# Patient Record
Sex: Male | Born: 1980 | Race: White | Hispanic: No | Marital: Single | Smoking: Current every day smoker
Health system: Southern US, Community
[De-identification: ages and names within clinical notes are randomized; demographics above are authoritative.]

## PROBLEM LIST (undated history)

## (undated) DIAGNOSIS — N200 Calculus of kidney: Secondary | ICD-10-CM

---

## 2012-11-23 ENCOUNTER — Emergency Department (HOSPITAL_COMMUNITY): Payer: Self-pay

## 2012-11-23 ENCOUNTER — Emergency Department (HOSPITAL_COMMUNITY)
Admission: EM | Admit: 2012-11-23 | Discharge: 2012-11-23 | Disposition: A | Discharge status: Home / Self Care | Payer: Self-pay | Attending: Emergency Medicine | Admitting: Emergency Medicine

## 2012-11-23 ENCOUNTER — Encounter (HOSPITAL_COMMUNITY): Payer: Self-pay | Admitting: Emergency Medicine

## 2012-11-23 DIAGNOSIS — R079 Chest pain, unspecified: Secondary | ICD-10-CM

## 2012-11-23 DIAGNOSIS — Y9355 Activity, bike riding: Secondary | ICD-10-CM | POA: Insufficient documentation

## 2012-11-23 DIAGNOSIS — S8002XA Contusion of left knee, initial encounter: Secondary | ICD-10-CM

## 2012-11-23 DIAGNOSIS — F17200 Nicotine dependence, unspecified, uncomplicated: Secondary | ICD-10-CM | POA: Insufficient documentation

## 2012-11-23 DIAGNOSIS — Z23 Encounter for immunization: Secondary | ICD-10-CM | POA: Insufficient documentation

## 2012-11-23 DIAGNOSIS — S8000XA Contusion of unspecified knee, initial encounter: Secondary | ICD-10-CM | POA: Insufficient documentation

## 2012-11-23 DIAGNOSIS — S20212A Contusion of left front wall of thorax, initial encounter: Secondary | ICD-10-CM

## 2012-11-23 DIAGNOSIS — Y92410 Unspecified street and highway as the place of occurrence of the external cause: Secondary | ICD-10-CM | POA: Insufficient documentation

## 2012-11-23 DIAGNOSIS — S20219A Contusion of unspecified front wall of thorax, initial encounter: Secondary | ICD-10-CM | POA: Insufficient documentation

## 2012-11-23 MED ORDER — HYDROCODONE-ACETAMINOPHEN 5-325 MG PO TABS: 1.0000 | ORAL_TABLET | ORAL | Status: AC | PRN

## 2012-11-23 MED ORDER — TETANUS-DIPHTH-ACELL PERTUSSIS 5-2.5-18.5 LF-MCG/0.5 IM SUSP
0.5000 mL | Freq: Once | INTRAMUSCULAR | Status: AC
  Administered 2012-11-23: 0.5 mL via INTRAMUSCULAR
  Filled 2012-11-23: qty 0.5

## 2012-11-23 MED ORDER — MORPHINE SULFATE 4 MG/ML IJ SOLN
6.0000 mg | Freq: Once | INTRAMUSCULAR | Status: DC
  Filled 2012-11-23: qty 2

## 2012-11-23 MED ORDER — KETOROLAC TROMETHAMINE 30 MG/ML IJ SOLN
30.0000 mg | Freq: Once | INTRAMUSCULAR | Status: AC
  Administered 2012-11-23: 30 mg via INTRAVENOUS
  Filled 2012-11-23: qty 1

## 2012-11-23 MED ORDER — IBUPROFEN 600 MG PO TABS: 600.0000 mg | ORAL_TABLET | Freq: Three times a day (TID) | ORAL | Status: AC | PRN

## 2012-11-23 MED ORDER — HYDROMORPHONE HCL PF 1 MG/ML IJ SOLN
1.0000 mg | Freq: Once | INTRAMUSCULAR | Status: AC
  Administered 2012-11-23: 1 mg via INTRAVENOUS
  Filled 2012-11-23: qty 1

## 2012-11-23 MED ORDER — OXYCODONE-ACETAMINOPHEN 5-325 MG PO TABS
2.0000 | ORAL_TABLET | Freq: Once | ORAL | Status: AC
  Administered 2012-11-23: 2 via ORAL
  Filled 2012-11-23: qty 2
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
## 2016-07-17 ENCOUNTER — Ambulatory Visit (INDEPENDENT_AMBULATORY_CARE_PROVIDER_SITE_OTHER): Payer: Worker's Compensation

## 2016-07-17 ENCOUNTER — Encounter: Payer: Self-pay | Admitting: Family Medicine

## 2016-07-17 ENCOUNTER — Ambulatory Visit (INDEPENDENT_AMBULATORY_CARE_PROVIDER_SITE_OTHER): Payer: Worker's Compensation | Admitting: Family Medicine

## 2016-07-17 VITALS — BP 122/75 | HR 61 | Temp 98.3°F | Resp 18 | Ht 70.0 in | Wt 178.8 lb

## 2016-07-17 DIAGNOSIS — S6010XA Contusion of unspecified finger with damage to nail, initial encounter: Secondary | ICD-10-CM | POA: Diagnosis not present

## 2016-07-17 DIAGNOSIS — M79645 Pain in left finger(s): Secondary | ICD-10-CM

## 2016-07-17 NOTE — Progress Notes (Signed)
     Corey Duncan [birth year] 36 y.o.   Chief Complaint  Patient presents with  . Hand Pain    smashed left hand ring finger x2days     Presents for evaluation of work-related complaint.  Date of Injury: 07/15/16  History of Present Illness:  Patient was at work on Tuesday.  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  He was taking materials off a roller and thinks his glove got caught in a press.  It pulled his Left Ring finger under the press.  Had immediate pain.  Continued working but difficult due to pain and needing to use his hands.    Pain has continued.  Bruising and swelling distal finger including hematoma formation under nail.  No further injury to finger since then.  Has had difficulty sleeping at night due to pain.  Not relieved with OTC analgesics.    Has broken multiple fingers in past.  Is worried this has happened again.    No deformity to finger.  No redness or drainage.     ROS The patient denies fever, unusual weight change, decreased hearing, chest pain, palpitations, pre-syncopal or syncopal episodes, dyspnea on exertion, prolonged cough, hemoptysis, change in bowel habits, melena, hematochezia, severe indigestion/heartburn, nausea/vomiting/abdominal pain,abornmal bleeding.  Current medications and allergies reviewed and updated. Past medical history, family history, social history have been reviewed and updated.   Physical Exam  Gen:  Alert, cooperative patient who appears stated age in no acute distress.  Vital signs reviewed. Ext:  Right hand WNL - Left hand:  4th digit with subungual hematoma.  Some bruising surrounding nail.  TTP directly here and distal to DIP joint.  No crepitus noted.  No bone deformity.  Some mild tenderness middle phalanges.  No tenderness PIP joint.  No tenderness proximal to PIP joint.  Can bend finger about halfway before being limited by pain.  No rotation of finger.    Assessment: 1.  Subungual Hematoma: -  evacuated in clinic.  Verbal consent obtained.  Nail puncture using 18 gauge needle. Immediate relief.  Evacuation of dark red venous blood.  Hematoma evacuated and bruising below nail improved.  No complications.  Bleeding stopped easily with direct pressure.  Patient tolerated procedure well.  Bandaged here in clinic.  To keep clean.  No need prophylactic abx currently.    2.  Bruised finger - No evidence of fracture on radiographs - OTC analgesics should be sufficient for pain relief s/p evacuation, which relieved a great deal of his pain.   - FU if no improvement in pain next 3-4 days.

## 2016-07-17 NOTE — Patient Instructions (Signed)
     IF you received an x-ray today, you will receive an invoice from [facility]. Please contact [facility] at [phone] with questions or concerns regarding your invoice.   IF you received labwork today, you will receive an invoice from [facility]. Please contact [facility] at [phone] with questions or concerns regarding your invoice.   Our billing staff will not be able to assist you with questions regarding bills from these companies.  You will be contacted with the lab results as soon as they are available. The fastest way to get your results is to activate your My Chart account. Instructions are located on the last page of this paperwork. If you have not heard from us regarding the results in 2 weeks, please contact this office.     

## 2018-10-05 ENCOUNTER — Other Ambulatory Visit: Payer: Self-pay | Admitting: Obstetrics and Gynecology

## 2020-10-04 ENCOUNTER — Encounter (HOSPITAL_COMMUNITY): Payer: Self-pay | Admitting: Emergency Medicine

## 2020-10-04 ENCOUNTER — Emergency Department (HOSPITAL_COMMUNITY): Payer: PRIVATE HEALTH INSURANCE

## 2020-10-04 ENCOUNTER — Emergency Department (HOSPITAL_COMMUNITY)
Admission: EM | Admit: 2020-10-04 | Discharge: 2020-10-05 | Disposition: A | Discharge status: Home / Self Care | Payer: PRIVATE HEALTH INSURANCE | Attending: Emergency Medicine | Admitting: Emergency Medicine

## 2020-10-04 ENCOUNTER — Ambulatory Visit (HOSPITAL_COMMUNITY)
Admission: EM | Admit: 2020-10-04 | Discharge: 2020-10-04 | Disposition: A | Discharge status: Home / Self Care | Payer: PRIVATE HEALTH INSURANCE

## 2020-10-04 ENCOUNTER — Other Ambulatory Visit: Payer: Self-pay

## 2020-10-04 DIAGNOSIS — R109 Unspecified abdominal pain: Secondary | ICD-10-CM

## 2020-10-04 DIAGNOSIS — F17210 Nicotine dependence, cigarettes, uncomplicated: Secondary | ICD-10-CM | POA: Diagnosis not present

## 2020-10-04 DIAGNOSIS — R339 Retention of urine, unspecified: Secondary | ICD-10-CM | POA: Diagnosis not present

## 2020-10-04 DIAGNOSIS — R110 Nausea: Secondary | ICD-10-CM | POA: Diagnosis not present

## 2020-10-04 DIAGNOSIS — R1031 Right lower quadrant pain: Secondary | ICD-10-CM | POA: Diagnosis not present

## 2020-10-04 DIAGNOSIS — R195 Other fecal abnormalities: Secondary | ICD-10-CM | POA: Diagnosis not present

## 2020-10-04 DIAGNOSIS — K921 Melena: Secondary | ICD-10-CM

## 2020-10-04 DIAGNOSIS — Z91040 Latex allergy status: Secondary | ICD-10-CM | POA: Diagnosis not present

## 2020-10-04 HISTORY — DX: Calculus of kidney: N20.0

## 2020-10-04 LAB — URINALYSIS, ROUTINE W REFLEX MICROSCOPIC
Bilirubin Urine: NEGATIVE
Glucose, UA: NEGATIVE mg/dL
Hgb urine dipstick: NEGATIVE
Ketones, ur: NEGATIVE mg/dL
Leukocytes,Ua: NEGATIVE
Nitrite: NEGATIVE
Protein, ur: NEGATIVE mg/dL
Specific Gravity, Urine: >1.03 — ABNORMAL HIGH (ref 1.005–1.030)
pH: 6 (ref 5.0–8.0)

## 2020-10-04 MED ORDER — OXYCODONE-ACETAMINOPHEN 5-325 MG PO TABS
1.0000 | ORAL_TABLET | Freq: Once | ORAL | Status: AC
  Administered 2020-10-04: 1 via ORAL
  Filled 2020-10-04: qty 1

## 2020-10-04 NOTE — ED Triage Notes (Signed)
Pt repots passed 2 kidney stones yesterday. Reports still having right flank pains and when voids only little amounts will come out at at a time. Pt reports having rectal bleeding when has a BM that started yesterday. Blood is bright red. Denies having to strain or hard or loose stools. Hx hemorrhoids 15 years ago. Deniestaking blood thinners.

## 2020-10-04 NOTE — ED Provider Notes (Signed)
Emergency Medicine Provider Triage Evaluation Note  Corey Duncan , a 40 y.o. male  was evaluated in triage.  Pt complains of kidney stones. Symptoms started Monday and passed 2 stones on Wednesday. Sharp/stabbing pain in R flank. Difficulty urinating.  Review of Systems  Positive: Flank pain, abdominal pain, decreased urination Negative: Fever, chills, nausea, vomiting  Physical Exam  BP 125/87 (BP Location: Left Arm)   Pulse (!) 102   Temp 99.8 F (37.7 C)   Resp 16   SpO2 96%  Gen:   Awake, no distress   Resp:  Normal effort  MSK:   Moves extremities without difficulty Other:    Medical Decision Making  Medically screening exam initiated at 8:01 PM.  Appropriate orders placed.  Corey Duncan was informed that the remainder of the evaluation will be completed by another provider, this initial triage assessment does not replace that evaluation, and the importance of remaining in the ED until their evaluation is complete.     Jeanella Flattery 10/04/20 2004    Arby Barrette, MD 10/04/20 [phone]

## 2020-10-04 NOTE — ED Notes (Signed)
Patient called for vital x3.

## 2020-10-04 NOTE — ED Triage Notes (Signed)
Pt states he's got kidney stones, passed 2 yesterday, symptom onset Monday. States he is "barely" able to pass urine. Hx of same, states symptoms are the same. Pain worse in R back 7/10 dull ache to sharp/stabby.  Last dose of ibuprofen approx 1400

## 2020-10-04 NOTE — ED Provider Notes (Signed)
Patient reports only urinating about an ounce at a time due to difficulty urinating.  Continues to have significant flank pain.  Recommended further evaluation in the ED for CT to rule out stone obstruction.   Tomi Bamberger, PA-C 10/04/20 1931

## 2020-10-05 LAB — CBC WITH DIFFERENTIAL/PLATELET
Abs Immature Granulocytes: 0.01 10*3/uL (ref 0.00–0.07)
Basophils Absolute: 0 10*3/uL (ref 0.0–0.1)
Basophils Relative: 0 %
Eosinophils Absolute: 0 10*3/uL (ref 0.0–0.5)
Eosinophils Relative: 0 %
HCT: 46.2 % (ref 39.0–52.0)
Hemoglobin: 15.2 g/dL (ref 13.0–17.0)
Immature Granulocytes: 0 %
Lymphocytes Relative: 32 %
Lymphs Abs: 1.3 10*3/uL (ref 0.7–4.0)
MCH: 29.7 pg (ref 26.0–34.0)
MCHC: 32.9 g/dL (ref 30.0–36.0)
MCV: 90.4 fL (ref 80.0–100.0)
Monocytes Absolute: 0.3 10*3/uL (ref 0.1–1.0)
Monocytes Relative: 6 %
Neutro Abs: 2.4 10*3/uL (ref 1.7–7.7)
Neutrophils Relative %: 62 %
Platelets: 79 10*3/uL — ABNORMAL LOW (ref 150–400)
RBC: 5.11 MIL/uL (ref 4.22–5.81)
RDW: 13.9 % (ref 11.5–15.5)
WBC: 3.9 10*3/uL — ABNORMAL LOW (ref 4.0–10.5)
nRBC: 0 % (ref 0.0–0.2)

## 2020-10-05 LAB — BASIC METABOLIC PANEL
Anion gap: 8 (ref 5–15)
BUN: 14 mg/dL (ref 6–20)
CO2: 24 mmol/L (ref 22–32)
Calcium: 7.8 mg/dL — ABNORMAL LOW (ref 8.9–10.3)
Chloride: 102 mmol/L (ref 98–111)
Creatinine, Ser: 0.92 mg/dL (ref 0.61–1.24)
GFR, Estimated: >60 mL/min (ref >60)
Glucose, Bld: 96 mg/dL (ref 70–99)
Potassium: 3.6 mmol/L (ref 3.5–5.1)
Sodium: 134 mmol/L — ABNORMAL LOW (ref 135–145)

## 2020-10-05 LAB — POC OCCULT BLOOD, ED: Fecal Occult Bld: NEGATIVE

## 2020-10-05 MED ORDER — KETOROLAC TROMETHAMINE 15 MG/ML IJ SOLN
15.0000 mg | Freq: Once | INTRAMUSCULAR | Status: AC
  Administered 2020-10-05: 15 mg via INTRAVENOUS
  Filled 2020-10-05: qty 1

## 2020-10-05 MED ORDER — METHOCARBAMOL 500 MG PO TABS
1000.0000 mg | ORAL_TABLET | Freq: Once | ORAL | Status: AC
  Administered 2020-10-05: 1000 mg via ORAL
  Filled 2020-10-05: qty 2

## 2020-10-05 MED ORDER — DICYCLOMINE HCL 20 MG PO TABS: 20.0000 mg | ORAL_TABLET | Freq: Two times a day (BID) | ORAL | 0 refills | Status: AC

## 2020-10-05 MED ORDER — ONDANSETRON 4 MG PO TBDP: 4.0000 mg | ORAL_TABLET | Freq: Three times a day (TID) | ORAL | 0 refills | Status: AC | PRN

## 2020-10-05 MED ORDER — SODIUM CHLORIDE 0.9 % IV BOLUS
1000.0000 mL | Freq: Once | INTRAVENOUS | Status: AC
  Administered 2020-10-05: 1000 mL via INTRAVENOUS

## 2020-10-05 MED ORDER — ONDANSETRON HCL 4 MG/2ML IJ SOLN
4.0000 mg | Freq: Once | INTRAMUSCULAR | Status: AC
Start: 2020-10-05 — End: 2020-10-05
  Administered 2020-10-05: 4 mg via INTRAVENOUS
  Filled 2020-10-05: qty 2

## 2020-10-05 NOTE — ED Notes (Signed)
Patient given discharge instructions. Questions were answered. Patient verbalized understanding of discharge instructions and care at home.  

## 2020-10-05 NOTE — ED Provider Notes (Signed)
[facility] EMERGENCY DEPARTMENT Provider Note   CSN: [id number] Arrival date & time: 10/04/20  1944     History Chief Complaint  Patient presents with   Abdominal Pain   Flank Pain    Corey Duncan is a 40 y.o. male presenting for evaluation of right-sided flank pain and decreased urination.  Patient states on Monday he started to have pain in the right flank and lower abdomen.  Consistent with previous kidney stones.  He saw 2 kidney stones passed yesterday.  Initially his pain was improved, however over the past day, pain has worsened again.  He also states he feels he needs to urinate, but not much is coming out.  He reports associated nausea, no vomiting.  No fevers or chills.  Patient states also in the same amount of time he has been having bright red blood in his stool.  No blood in his underwear between bowel movements.  He is not on blood thinners.  No history of similar.  He has not have any straining.  He has no other medical problems, takes no medications daily.  HPI     Past Medical History:  Diagnosis Date   Kidney stone     There are no problems to display for this patient.   No past surgical history on file.     No family history on file.  Social History   Tobacco Use   Smoking status: Every Day    Packs/day: 1.50    Types: Cigarettes   Smokeless tobacco: Current  Substance Use Topics   Alcohol use: Yes    Alcohol/week: 12.0 standard drinks    Types: 12 Cans of beer per week   Drug use: No    Home Medications Prior to Admission medications   Medication Sig Start Date End Date Taking? Authorizing Provider  dicyclomine (BENTYL) 20 MG tablet Take 1 tablet (20 mg total) by mouth 2 (two) times daily. 10/05/20  Yes Babyboy Loya, PA-C  ondansetron (ZOFRAN ODT) 4 MG disintegrating tablet Take 1 tablet (4 mg total) by mouth every 8 (eight) hours as needed for nausea or vomiting. 10/05/20  Yes Ilissa Rosner, PA-C   HYDROcodone-acetaminophen (NORCO/VICODIN) 5-325 MG per tablet Take 1 tablet by mouth every 4 (four) hours as needed for pain. Patient not taking: No sig reported 11/23/12   Azalia Bilis, MD  ibuprofen (ADVIL,MOTRIN) 200 MG tablet Take 800 mg by mouth every 6 (six) hours as needed for pain (back pain   takes everyday).    [provider]  ibuprofen (ADVIL,MOTRIN) 600 MG tablet Take 1 tablet (600 mg total) by mouth every 8 (eight) hours as needed for pain. Patient not taking: No sig reported 11/23/12   Azalia Bilis, MD    Allergies    Latex  Review of Systems   Review of Systems  Gastrointestinal:  Positive for abdominal pain and nausea.  Genitourinary:  Positive for decreased urine volume and flank pain.  All other systems reviewed and are negative.  Physical Exam Updated Vital Signs BP 107/71 (BP Location: Right Arm)   Pulse 62   Temp 98.9 F (37.2 C) (Oral)   Resp 16   SpO2 97%   Physical Exam Vitals and nursing note reviewed. Exam conducted with a chaperone present.  Constitutional:      General: He is not in acute distress.    Appearance: Normal appearance.     Comments: nontoxic  HENT:     Head: Normocephalic and atraumatic.  Eyes:  Conjunctiva/sclera: Conjunctivae normal.     Pupils: Pupils are equal, round, and reactive to light.  Cardiovascular:     Rate and Rhythm: Normal rate and regular rhythm.     Pulses: Normal pulses.  Pulmonary:     Effort: Pulmonary effort is normal. No respiratory distress.     Breath sounds: Normal breath sounds. No wheezing.     Comments: Speaking in full sentences.  Clear lung sounds in all fields. Abdominal:     General: There is no distension.     Palpations: Abdomen is soft.     Tenderness: There is no abdominal tenderness. There is right CVA tenderness.  Genitourinary:    Comments: No gross bleeding. No hemorrhoids. Hemoccult negative.  Musculoskeletal:        General: Normal range of motion.     Cervical back:  Normal range of motion and neck supple.  Skin:    General: Skin is warm and dry.     Capillary Refill: Capillary refill takes less than 2 seconds.  Neurological:     Mental Status: He is alert and oriented to person, place, and time.  Psychiatric:        Mood and Affect: Mood and affect normal.        Speech: Speech normal.        Behavior: Behavior normal.    ED Results / Procedures / Treatments   Labs (all labs ordered are listed, but only abnormal results are displayed) Labs Reviewed  URINALYSIS, ROUTINE W REFLEX MICROSCOPIC - Abnormal; Notable for the following components:      Result Value   Specific Gravity, Urine >1.030 (*)    All other components within normal limits  CBC WITH DIFFERENTIAL/PLATELET - Abnormal; Notable for the following components:   WBC 3.9 (*)    Platelets 79 (*)    All other components within normal limits  BASIC METABOLIC PANEL - Abnormal; Notable for the following components:   Sodium 134 (*)    Calcium 7.8 (*)    All other components within normal limits  POC OCCULT BLOOD, ED    EKG None  Radiology CT Renal Stone Study  Result Date: 10/04/2020 CLINICAL DATA:  Right flank pain, rectal bleeding. EXAM: CT ABDOMEN AND PELVIS WITHOUT CONTRAST TECHNIQUE: Multidetector CT imaging of the abdomen and pelvis was performed following the standard protocol without IV contrast. COMPARISON:  July 14, 2012. FINDINGS: Lower chest: No acute abnormality. Hepatobiliary: No focal liver abnormality is seen. No gallstones, gallbladder wall thickening, or biliary dilatation. Pancreas: Unremarkable. No pancreatic ductal dilatation or surrounding inflammatory changes. Spleen: Normal in size without focal abnormality. Adrenals/Urinary Tract: Adrenal glands are unremarkable. Kidneys are normal, without renal calculi, focal lesion, or hydronephrosis. Bladder is unremarkable. Stomach/Bowel: Stomach is within normal limits. Appendix appears normal. No evidence of bowel wall  thickening, distention, or inflammatory changes. Vascular/Lymphatic: No significant vascular findings are present. No enlarged abdominal or pelvic lymph nodes. Reproductive: Prostate is unremarkable. Other: No abdominal wall hernia or abnormality. No abdominopelvic ascites. Musculoskeletal: No acute or significant osseous findings. IMPRESSION: No definite abnormality seen in the abdomen or pelvis. Electronically Signed   By: Lupita Raider M.D.   On: 10/04/2020 20:41    Procedures Procedures   Medications Ordered in ED Medications  oxyCODONE-acetaminophen (PERCOCET/ROXICET) 5-325 MG per tablet 1 tablet (1 tablet Oral Given 10/04/20 2003)  ketorolac (TORADOL) 15 MG/ML injection 15 mg (15 mg Intravenous Given 10/05/20 0322)  ondansetron (ZOFRAN) injection 4 mg (4 mg Intravenous Given 10/05/20  0768)  sodium chloride 0.9 % bolus 1,000 mL (0 mLs Intravenous Stopped 10/05/20 0421)  methocarbamol (ROBAXIN) tablet 1,000 mg (1,000 mg Oral Given 10/05/20 0420)    ED Course  I have reviewed the triage vital signs and the nursing notes.  Pertinent labs & imaging results that were available during my care of the patient were reviewed by me and considered in my medical decision making (see chart for details).    MDM Rules/Calculators/A&P                           Patient had upper evaluation of right flank pain and decreased urination.  On exam, patient peers nontoxic.  He does have right CVA tenderness as well as pain in the lower abdomen.  No chest palpation of the left side.  Urine obtained from triage overall reassuring, no signs of infection.  No blood.  CT from triage negative for kidney stone.  Also does not show other intra-abdominal abnormality such as appendicitis, diverticulitis, obstruction.  Likely residual inflammation discomfort following kidney stone passing.  Will treat symptomatically.  Regarding patient's rectal bleeding, will perform rectal exam and check CBC and platelets.  Rectal exam  overall reassuring.  No gross bleeding.  No hemorrhoids.  Hemoccult was negative.  Labs overall reassuring.  On reevaluation after symptomatic medication, patient reports improvement of symptoms.  Discussed pain could be coming from recently passed kidney stone versus GI illness that could be causing intestinal spasm/bleeding.  Discussed that without active rectal bleeding, patient does not need to be admitted for this.  Discussed symptomatic management.  Discussed that in the setting of a reassuring CT, and without fever, low suspicion for intra-abdominal infection that would require antibiotics.  Will have patient follow-up with GI as needed if rectal bleeding persists.  At this time, patient appears safe for discharge.  Return precautions given.  Patient states he understands and agrees to plan.  Final Clinical Impression(s) / ED Diagnoses Final diagnoses:  Right sided abdominal pain  Blood in stool    Rx / DC Orders ED Discharge Orders          Ordered    dicyclomine (BENTYL) 20 MG tablet  2 times daily        10/05/20 0601    ondansetron (ZOFRAN ODT) 4 MG disintegrating tablet  Every 8 hours PRN        10/05/20 0601             Alveria Apley, PA-C 10/05/20 0881    Dione Booze, MD 10/05/20 [phone]

## 2020-10-05 NOTE — Discharge Instructions (Signed)
Use zofran as needed for nausea or vomiting.  Use tylenol and/or ibuprofen as needed orf pain.  Use bentyl as needed for abdominal pain or spasms.  Follow up with your primary care doctor as needed for recheck.  If you do not have a primary care doctor, and you continue to have blood in your stool, follow up with the GI doctor listed below.  Return to the ER if you develop fever, persistent vomiting, severe worsening pain, large blood clots coming from your rectum, or any new, worsening, or concerning symptoms.

## 2022-02-06 IMAGING — CT CT RENAL STONE PROTOCOL
2 of 4 series · 17 of 46 positions shown, 19 images · non-contrast
Comparison: July 14, 2012.

CLINICAL DATA: Right flank pain, rectal bleeding.

EXAM:
CT ABDOMEN AND PELVIS WITHOUT CONTRAST
TECHNIQUE: Multidetector CT imaging of the abdomen and pelvis was performed
following the standard protocol without IV contrast.

[Series 3: renal stone 5mm · axial · 0.93mm/px · z∈[-171,+264]mm · 14 of 95 slices shown, 16 images]
[im 4/95  soft-tissue]
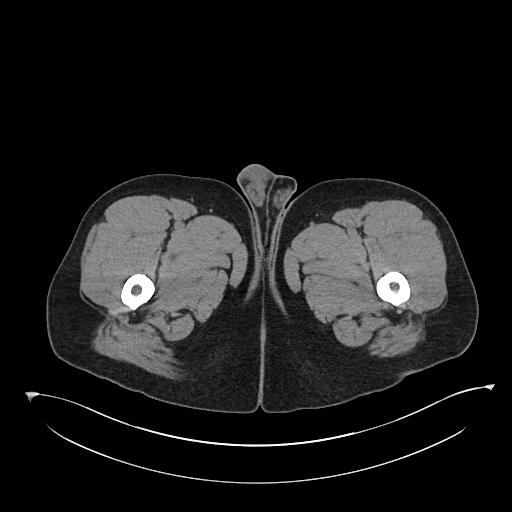
[im 4/95  bone]
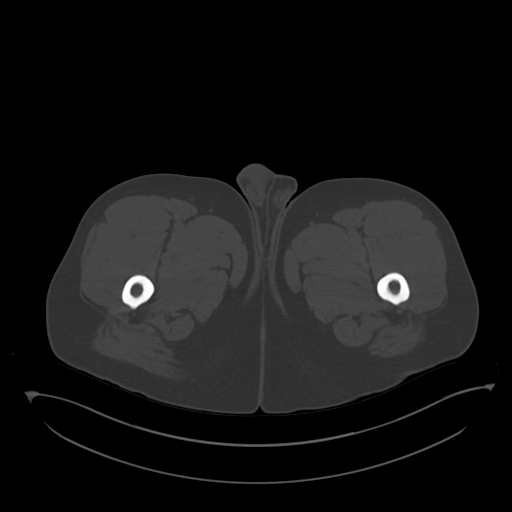
[im 12/95  soft-tissue]
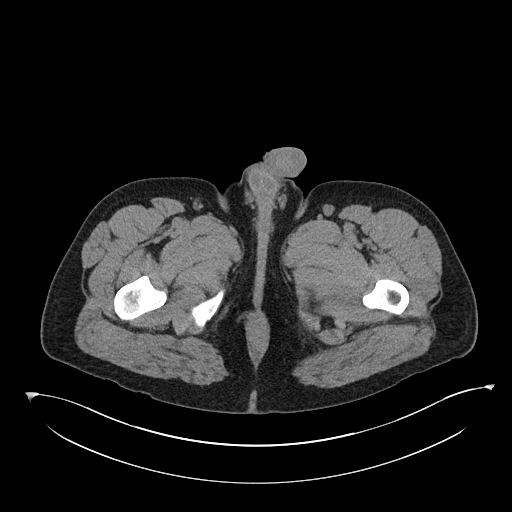
[im 19/95  soft-tissue]
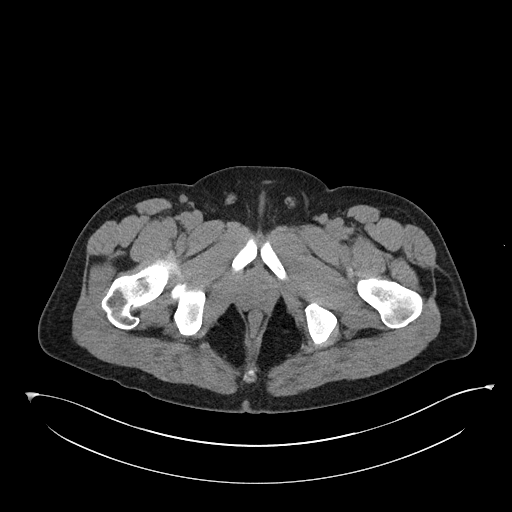
[im 27/95  soft-tissue]
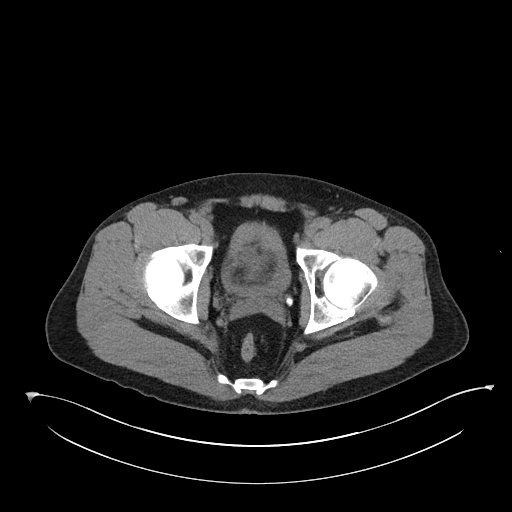
[im 31/95  soft-tissue]
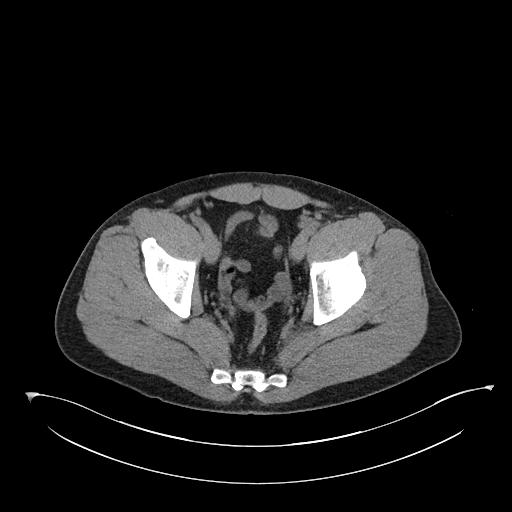
[im 38/95  soft-tissue]
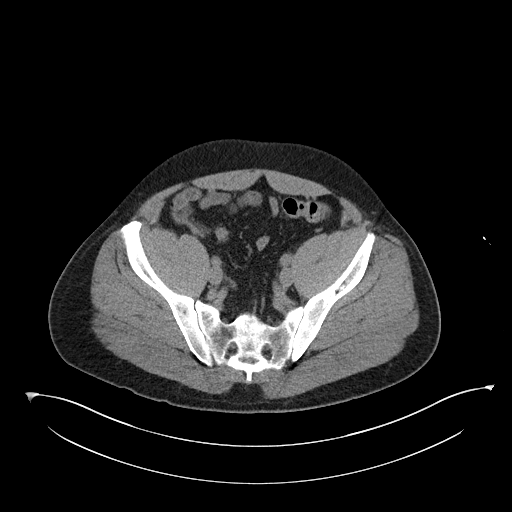
[im 46/95  soft-tissue]
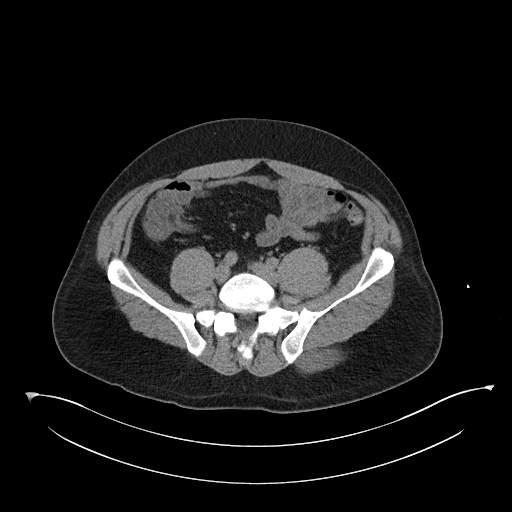
[im 49/95  soft-tissue]
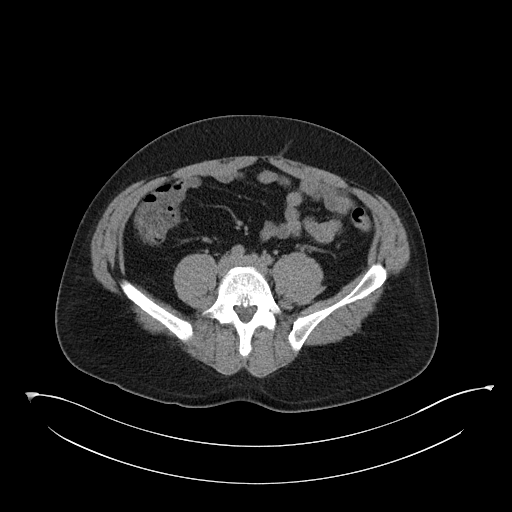
[im 57/95  soft-tissue]
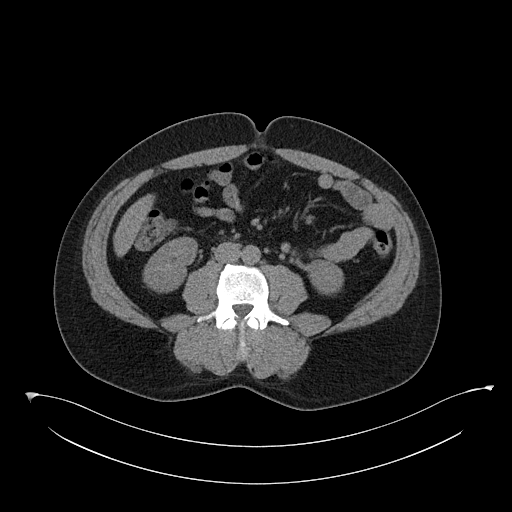
[im 57/95  bone]
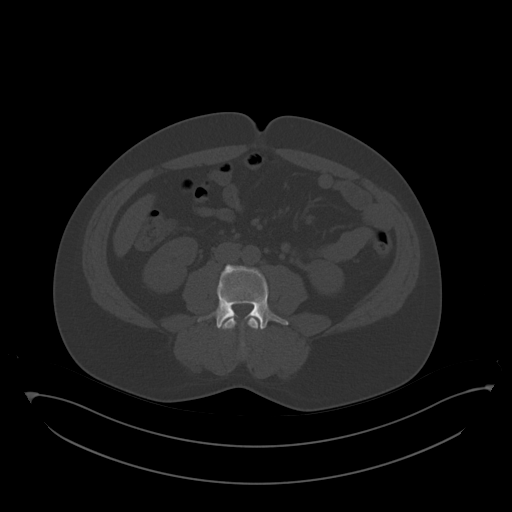
[im 64/95  soft-tissue]
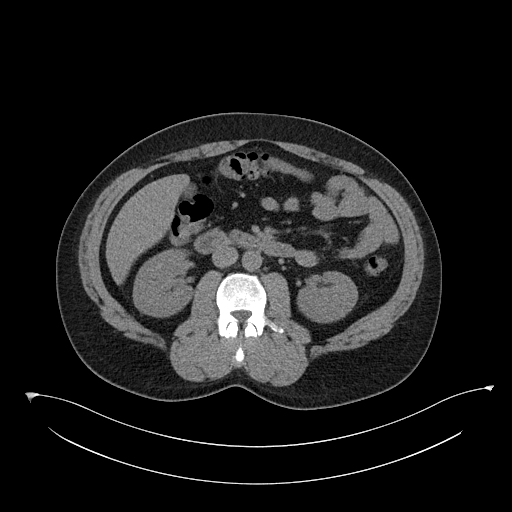
[im 72/95  soft-tissue]
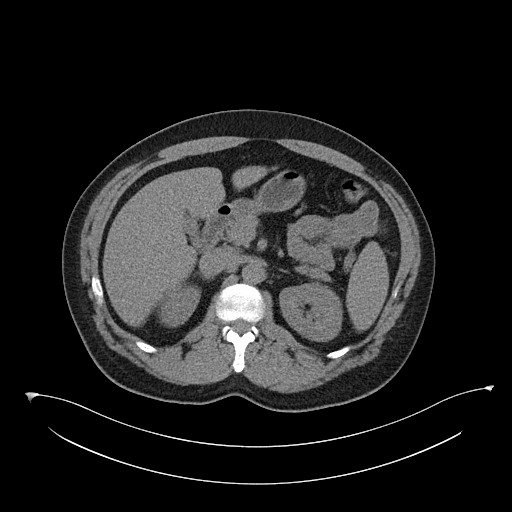
[im 76/95  soft-tissue]
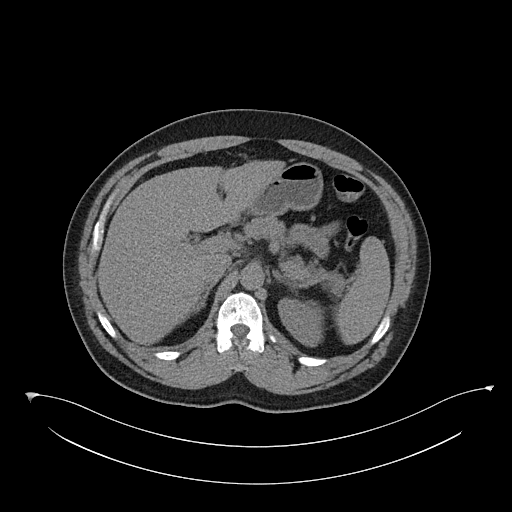
[im 83/95  soft-tissue]
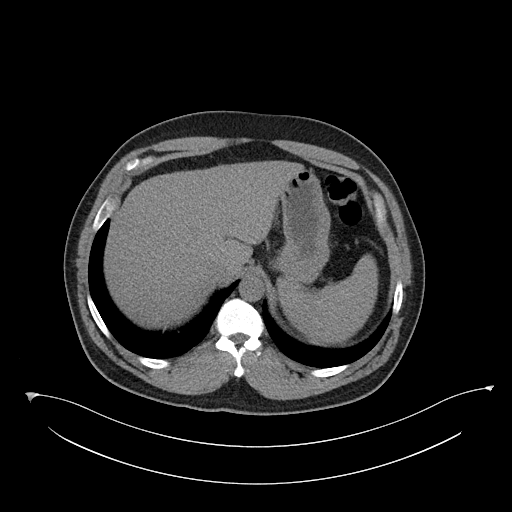
[im 91/95  soft-tissue]
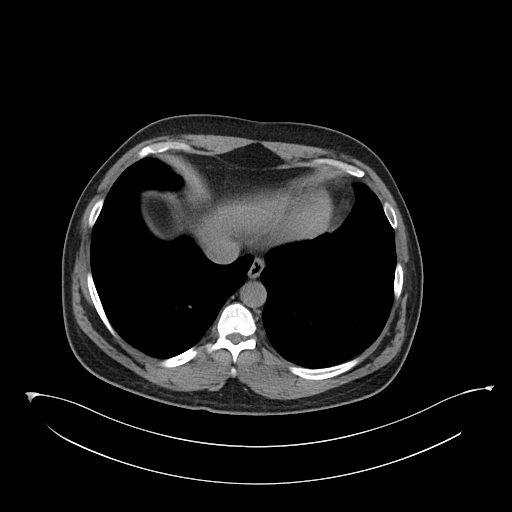

[Series 5: renal stone 3.0 cor · coronal · 0.82mm/px · 3 of 102 slices shown]
[im 34/102  soft-tissue]
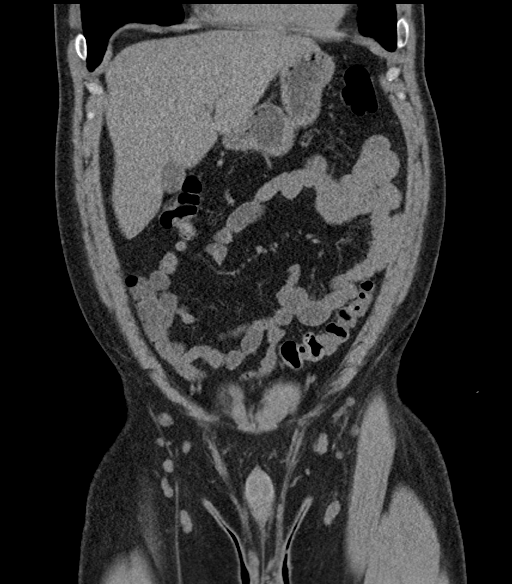
[im 45/102  soft-tissue]
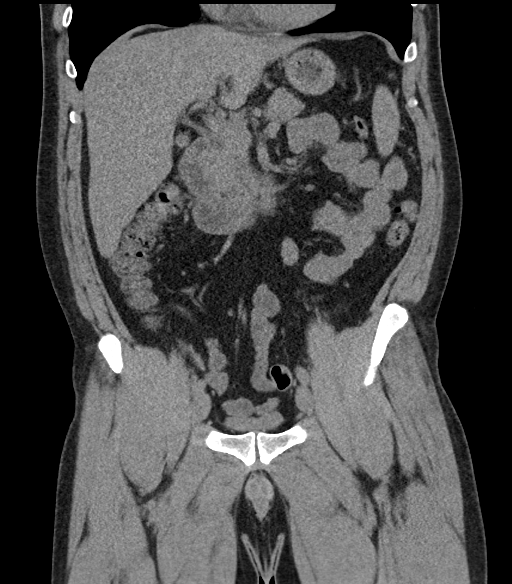
[im 57/102  soft-tissue]
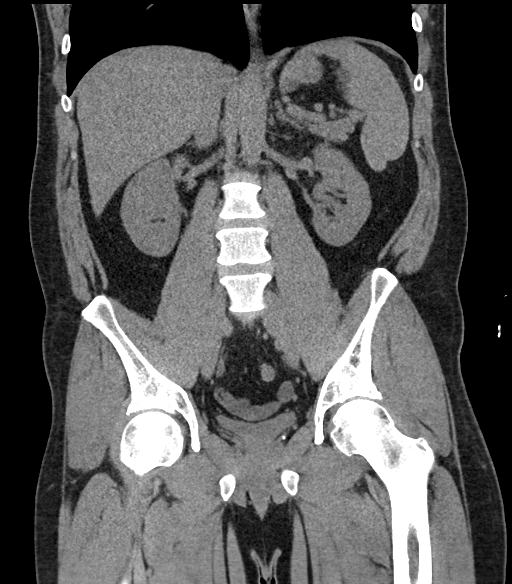

[17 of 46 positions shown; findings below may reference images not displayed]

FINDINGS: Lower chest: No acute abnormality.

Hepatobiliary: No focal liver abnormality is seen. No gallstones,
gallbladder wall thickening, or biliary dilatation.

Pancreas: Unremarkable. No pancreatic ductal dilatation or
surrounding inflammatory changes.

Spleen: Normal in size without focal abnormality.

Adrenals/Urinary Tract: Adrenal glands are unremarkable. Kidneys are
normal, without renal calculi, focal lesion, or hydronephrosis.
Bladder is unremarkable.

Stomach/Bowel: Stomach is within normal limits. Appendix appears
normal. No evidence of bowel wall thickening, distention, or
inflammatory changes.

Vascular/Lymphatic: No significant vascular findings are present. No
enlarged abdominal or pelvic lymph nodes.

Reproductive: Prostate is unremarkable.

Other: No abdominal wall hernia or abnormality. No abdominopelvic
ascites.

Musculoskeletal: No acute or significant osseous findings.
IMPRESSION: No definite abnormality seen in the abdomen or pelvis.

## 2022-10-15 ENCOUNTER — Other Ambulatory Visit: Payer: Self-pay
